# Patient Record
Sex: Female | Born: 1965 | Race: White | Hispanic: No | Marital: Married | Smoking: Never smoker
Health system: Southern US, Community
[De-identification: ages and names within clinical notes are randomized; demographics above are authoritative.]

## PROBLEM LIST (undated history)

## (undated) DIAGNOSIS — I10 Essential (primary) hypertension: Secondary | ICD-10-CM

## (undated) DIAGNOSIS — R42 Dizziness and giddiness: Secondary | ICD-10-CM

## (undated) HISTORY — PX: APPENDECTOMY: SHX54

---

## 2007-11-21 ENCOUNTER — Emergency Department (HOSPITAL_COMMUNITY): Admission: EM | Admit: 2007-11-21 | Discharge: 2007-11-21 | Payer: Self-pay | Admitting: Emergency Medicine

## 2009-11-18 ENCOUNTER — Emergency Department (HOSPITAL_COMMUNITY): Admission: EM | Admit: 2009-11-18 | Discharge: 2009-11-18 | Payer: Self-pay | Admitting: Emergency Medicine

## 2010-10-23 LAB — URINALYSIS, ROUTINE W REFLEX MICROSCOPIC
Bilirubin Urine: NEGATIVE
Leukocytes, UA: NEGATIVE
Nitrite: NEGATIVE
Specific Gravity, Urine: 1.02 (ref 1.005–1.030)
Urobilinogen, UA: 0.2 mg/dL (ref 0.0–1.0)
pH: 5.5 (ref 5.0–8.0)

## 2010-10-23 LAB — COMPREHENSIVE METABOLIC PANEL
AST: 15 U/L (ref 0–37)
CO2: 28 mEq/L (ref 19–32)
Calcium: 9 mg/dL (ref 8.4–10.5)
Creatinine, Ser: 0.65 mg/dL (ref 0.4–1.2)
GFR calc Af Amer: >60 mL/min (ref >60)
GFR calc non Af Amer: >60 mL/min (ref >60)
Total Protein: 7.1 g/dL (ref 6.0–8.3)

## 2010-10-23 LAB — DIFFERENTIAL
Eosinophils Relative: 0 % (ref 0–5)
Lymphocytes Relative: 8 % — ABNORMAL LOW (ref 12–46)
Lymphs Abs: 1.1 10*3/uL (ref 0.7–4.0)
Monocytes Relative: 5 % (ref 3–12)
Neutrophils Relative %: 87 % — ABNORMAL HIGH (ref 43–77)

## 2010-10-23 LAB — CBC
MCHC: 35.3 g/dL (ref 30.0–36.0)
MCV: 83.7 fL (ref 78.0–100.0)
RBC: 4.77 MIL/uL (ref 3.87–5.11)
RDW: 13.9 % (ref 11.5–15.5)

## 2010-10-23 LAB — URINE MICROSCOPIC-ADD ON

## 2010-10-23 LAB — LIPASE, BLOOD: Lipase: 22 U/L (ref 11–59)

## 2010-10-23 LAB — SEDIMENTATION RATE: Sed Rate: 12 mm/hr (ref 0–22)

## 2011-04-30 LAB — CBC
HCT: 38
Hemoglobin: 13.2
RBC: 4.43
WBC: 12.3 — ABNORMAL HIGH

## 2011-04-30 LAB — POCT CARDIAC MARKERS
Operator id: 157891
Troponin i, poc: <0.05

## 2011-04-30 LAB — DIFFERENTIAL
Eosinophils Relative: 2
Lymphocytes Relative: 23
Lymphs Abs: 2.8
Monocytes Absolute: 1
Monocytes Relative: 8

## 2011-04-30 LAB — BASIC METABOLIC PANEL
GFR calc non Af Amer: >60
Potassium: 3.8
Sodium: 140

## 2015-05-26 ENCOUNTER — Encounter (HOSPITAL_COMMUNITY): Payer: Self-pay

## 2015-05-26 ENCOUNTER — Observation Stay (HOSPITAL_COMMUNITY)
Admission: EM | Admit: 2015-05-26 | Discharge: 2015-05-27 | Disposition: A | Discharge status: Home / Self Care | Payer: Self-pay | Attending: Internal Medicine | Admitting: Internal Medicine

## 2015-05-26 ENCOUNTER — Emergency Department (HOSPITAL_COMMUNITY): Payer: Self-pay

## 2015-05-26 DIAGNOSIS — I10 Essential (primary) hypertension: Secondary | ICD-10-CM

## 2015-05-26 DIAGNOSIS — E876 Hypokalemia: Secondary | ICD-10-CM | POA: Insufficient documentation

## 2015-05-26 DIAGNOSIS — R112 Nausea with vomiting, unspecified: Secondary | ICD-10-CM | POA: Insufficient documentation

## 2015-05-26 DIAGNOSIS — R42 Dizziness and giddiness: Principal | ICD-10-CM

## 2015-05-26 DIAGNOSIS — R1110 Vomiting, unspecified: Secondary | ICD-10-CM | POA: Diagnosis present

## 2015-05-26 DIAGNOSIS — D72829 Elevated white blood cell count, unspecified: Secondary | ICD-10-CM

## 2015-05-26 DIAGNOSIS — R61 Generalized hyperhidrosis: Secondary | ICD-10-CM | POA: Insufficient documentation

## 2015-05-26 HISTORY — DX: Dizziness and giddiness: R42

## 2015-05-26 HISTORY — DX: Essential (primary) hypertension: I10

## 2015-05-26 LAB — CBC WITH DIFFERENTIAL/PLATELET
Basophils Absolute: 0 10*3/uL (ref 0.0–0.1)
Basophils Relative: 0 %
EOS ABS: 0.1 10*3/uL (ref 0.0–0.7)
Eosinophils Relative: 1 %
HEMATOCRIT: 41.8 % (ref 36.0–46.0)
HEMOGLOBIN: 14.4 g/dL (ref 12.0–15.0)
LYMPHS ABS: 1.9 10*3/uL (ref 0.7–4.0)
Lymphocytes Relative: 15 %
MCH: 29.3 pg (ref 26.0–34.0)
MCHC: 34.4 g/dL (ref 30.0–36.0)
MCV: 85 fL (ref 78.0–100.0)
MONOS PCT: 7 %
Monocytes Absolute: 0.9 10*3/uL (ref 0.1–1.0)
NEUTROS PCT: 77 %
Neutro Abs: 9.2 10*3/uL — ABNORMAL HIGH (ref 1.7–7.7)
Platelets: 248 10*3/uL (ref 150–400)
RBC: 4.92 MIL/uL (ref 3.87–5.11)
RDW: 13.3 % (ref 11.5–15.5)
WBC: 12.1 10*3/uL — ABNORMAL HIGH (ref 4.0–10.5)

## 2015-05-26 LAB — RAPID URINE DRUG SCREEN, HOSP PERFORMED
Amphetamines: NOT DETECTED
BARBITURATES: NOT DETECTED
BENZODIAZEPINES: NOT DETECTED
COCAINE: NOT DETECTED
Opiates: NOT DETECTED
TETRAHYDROCANNABINOL: NOT DETECTED

## 2015-05-26 LAB — URINALYSIS, ROUTINE W REFLEX MICROSCOPIC
BILIRUBIN URINE: NEGATIVE
Glucose, UA: NEGATIVE mg/dL
KETONES UR: NEGATIVE mg/dL
LEUKOCYTES UA: NEGATIVE
NITRITE: NEGATIVE
PROTEIN: NEGATIVE mg/dL
Specific Gravity, Urine: 1.01 (ref 1.005–1.030)
Urobilinogen, UA: 0.2 mg/dL (ref 0.0–1.0)
pH: 6.5 (ref 5.0–8.0)

## 2015-05-26 LAB — BASIC METABOLIC PANEL
Anion gap: 10 (ref 5–15)
BUN: 13 mg/dL (ref 6–20)
CHLORIDE: 104 mmol/L (ref 101–111)
CO2: 26 mmol/L (ref 22–32)
CREATININE: 0.74 mg/dL (ref 0.44–1.00)
Calcium: 8.9 mg/dL (ref 8.9–10.3)
GFR calc Af Amer: >60 mL/min (ref >60)
GFR calc non Af Amer: >60 mL/min (ref >60)
Glucose, Bld: 172 mg/dL — ABNORMAL HIGH (ref 65–99)
Potassium: 3.1 mmol/L — ABNORMAL LOW (ref 3.5–5.1)
SODIUM: 140 mmol/L (ref 135–145)

## 2015-05-26 LAB — URINE MICROSCOPIC-ADD ON

## 2015-05-26 LAB — PREGNANCY, URINE: Preg Test, Ur: NEGATIVE

## 2015-05-26 MED ORDER — ONDANSETRON HCL 4 MG/2ML IJ SOLN
4.0000 mg | Freq: Once | INTRAMUSCULAR | Status: AC
  Administered 2015-05-26: 4 mg via INTRAVENOUS
  Filled 2015-05-26: qty 2

## 2015-05-26 MED ORDER — METOCLOPRAMIDE HCL 5 MG/ML IJ SOLN
10.0000 mg | Freq: Once | INTRAMUSCULAR | Status: AC
  Administered 2015-05-26: 10 mg via INTRAVENOUS
  Filled 2015-05-26: qty 2

## 2015-05-26 MED ORDER — MECLIZINE HCL 12.5 MG PO TABS
25.0000 mg | ORAL_TABLET | Freq: Once | ORAL | Status: AC
  Administered 2015-05-26: 25 mg via ORAL
  Filled 2015-05-26: qty 2

## 2015-05-26 MED ORDER — ONDANSETRON HCL 4 MG/2ML IJ SOLN
4.0000 mg | Freq: Four times a day (QID) | INTRAMUSCULAR | Status: DC
  Administered 2015-05-27 (×2): 4 mg via INTRAVENOUS
  Filled 2015-05-26 (×2): qty 2

## 2015-05-26 MED ORDER — ENOXAPARIN SODIUM 40 MG/0.4ML ~~LOC~~ SOLN
40.0000 mg | SUBCUTANEOUS | Status: DC
  Administered 2015-05-26: 40 mg via SUBCUTANEOUS
  Filled 2015-05-26: qty 0.4

## 2015-05-26 MED ORDER — SODIUM CHLORIDE 0.9 % IV SOLN
1000.0000 mL | INTRAVENOUS | Status: DC
  Administered 2015-05-26 (×2): 1000 mL via INTRAVENOUS

## 2015-05-26 MED ORDER — ACETAMINOPHEN 650 MG RE SUPP: 650.0000 mg | Freq: Four times a day (QID) | RECTAL | Status: DC | PRN

## 2015-05-26 MED ORDER — POTASSIUM CHLORIDE IN NACL 40-0.9 MEQ/L-% IV SOLN
INTRAVENOUS | Status: DC
  Administered 2015-05-26 – 2015-05-27 (×3): 100 mL/h via INTRAVENOUS

## 2015-05-26 MED ORDER — LORAZEPAM 2 MG/ML IJ SOLN
0.5000 mg | Freq: Once | INTRAMUSCULAR | Status: AC
  Administered 2015-05-26: 0.5 mg via INTRAVENOUS
  Filled 2015-05-26: qty 1

## 2015-05-26 MED ORDER — MECLIZINE HCL 12.5 MG PO TABS
25.0000 mg | ORAL_TABLET | Freq: Three times a day (TID) | ORAL | Status: DC
  Administered 2015-05-26 – 2015-05-27 (×3): 25 mg via ORAL
  Filled 2015-05-26 (×3): qty 2

## 2015-05-26 MED ORDER — POTASSIUM CHLORIDE CRYS ER 20 MEQ PO TBCR
40.0000 meq | EXTENDED_RELEASE_TABLET | Freq: Once | ORAL | Status: AC
  Administered 2015-05-26: 40 meq via ORAL
  Filled 2015-05-26: qty 2

## 2015-05-26 MED ORDER — SODIUM CHLORIDE 0.9 % IV SOLN
1000.0000 mL | Freq: Once | INTRAVENOUS | Status: AC
  Administered 2015-05-26: 1000 mL via INTRAVENOUS

## 2015-05-26 MED ORDER — DIPHENHYDRAMINE HCL 50 MG/ML IJ SOLN
25.0000 mg | Freq: Once | INTRAMUSCULAR | Status: AC
  Administered 2015-05-26: 25 mg via INTRAVENOUS
  Filled 2015-05-26: qty 1

## 2015-05-26 MED ORDER — AMLODIPINE BESYLATE 5 MG PO TABS
5.0000 mg | ORAL_TABLET | Freq: Every day | ORAL | Status: DC
  Administered 2015-05-26 – 2015-05-27 (×2): 5 mg via ORAL
  Filled 2015-05-26 (×2): qty 1

## 2015-05-26 MED ORDER — ACETAMINOPHEN 325 MG PO TABS: 650.0000 mg | ORAL_TABLET | Freq: Four times a day (QID) | ORAL | Status: DC | PRN

## 2015-05-26 MED ORDER — LORAZEPAM 2 MG/ML IJ SOLN
1.0000 mg | Freq: Four times a day (QID) | INTRAMUSCULAR | Status: DC | PRN
  Administered 2015-05-26: 1 mg via INTRAVENOUS
  Filled 2015-05-26: qty 1

## 2015-05-26 MED ORDER — ONDANSETRON HCL 4 MG PO TABS
4.0000 mg | ORAL_TABLET | Freq: Four times a day (QID) | ORAL | Status: DC
  Administered 2015-05-26 (×2): 4 mg via ORAL
  Filled 2015-05-26 (×2): qty 1

## 2015-05-26 MED ORDER — LORAZEPAM 2 MG/ML IJ SOLN
1.0000 mg | Freq: Once | INTRAMUSCULAR | Status: AC
  Administered 2015-05-26: 1 mg via INTRAVENOUS
  Filled 2015-05-26: qty 1

## 2015-05-26 MED ORDER — POTASSIUM CHLORIDE 10 MEQ/100ML IV SOLN
10.0000 meq | Freq: Once | INTRAVENOUS | Status: AC
  Administered 2015-05-26: 10 meq via INTRAVENOUS
  Filled 2015-05-26: qty 100

## 2015-05-26 NOTE — Evaluation (Signed)
Physical Therapy Evaluation Patient Details Name: Tammy Bridges MRN: [id number] DOB: Jun 01, 1966 Today's Date: 05/26/2015   History of Present Illness  Pt is a 48yo white female who arrived at [facility] after sudden, incidious onset of dizziness, nausea, and vommitting, with unrelenting room spinning sensation. Pt reports that she is most comfortable maintaning eyes closed. She has received only moderate help with N/V with medications since she arrived.  Pt reports that she had an  "inner ear infection' on the R about two weeks ago and has been following drops as prescribed. 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 Pt denies any previous episodes similar to this one, but reports occasional episodic vertigo in the morning that lasts for less than 60 seconds.   Clinical Impression  Pt is received semirecumbent in bed upon entry, awake, alert, and willing to participate. Pt reports mild improvement in nausea and vomiting with meds intervention, but still prefers to remain lying flat with eyes closed. Pt is A&Ox3 and pleasant. Pt reports zero falls in the last 6 months, and no previous episodes similar to this one. Assessment of eye tracking demonstrating a R beating nystagmus at forward gaze, that increases in rate with eye tracking to R, and decreases in rate with tracking to L. Mild hearing deficits detected on R side with high frequency testing. Pt is presenting with signs/symptoms consistent with a classic presentation of Meneire's Disease, which warrants additional follow up from neuro and/or ENT to provide in-depth differential diagnosis. Granted pt appears to present during an acute episode, PT intervention is limited at this time. Further in-depth PT assessment of vestibular function, balance, and hearing should be performed once current episode has resolved. Patient presenting with impairment of  strength, activity tolerance, hearing deficits, and vestibular dysfunction, limiting ability to perform ADL and mobility tasks at  baseline level of function. Patient will benefit from skilled intervention to address the above impairments and limitations, in order to restore to prior level of function, improve patient safety upon discharge, and to decrease falls risk.       Follow Up Recommendations Home health PT;Supervision for mobility/OOB    Equipment Recommendations  None recommended by PT    Recommendations for Other Services       Precautions / Restrictions Precautions Precautions: None Restrictions Weight Bearing Restrictions: No      Mobility  Bed Mobility               General bed mobility comments: Does not tolerate due to N/V continued spinning sensation.   Transfers                 General transfer comment: Does not tolerate due to N/V continued spinning sensation.   Ambulation/Gait                Stairs            Wheelchair Mobility    Modified Rankin (Stroke Patients Only)       Balance Overall balance assessment:  (Inappropriate at this time due to continued R beating nystagmus., adn poor tolerance to positional changes. 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      Hand Dominance        Extremity/Trunk Assessment   Upper Extremity Assessment: Generalized weakness;Overall [facility] for tasks assessed (denies  paresthesia or focal weakness. )           Lower Extremity Assessment: Generalized weakness;Overall [facility] for tasks assessed (Difficulty walking to come to hospital; not assessed at this time in depth due to poor tolerance. denies paresthesia or focal weakness. )      Cervical / Trunk Assessment: Normal (Cervical rotational ROM is WFL and near equal bilat with symptoms worse turning toward L.)  Communication   Communication: No difficulties  Cognition Arousal/Alertness: Awake/alert (maintains eyes closed to avoid exacerbation of spinning sensation. ) Behavior During Therapy: WFL for tasks assessed/performed Overall Cognitive Status: Within Functional Limits for tasks assessed                      General Comments      Exercises        Assessment/Plan    PT Assessment Patient needs continued PT services  PT Diagnosis Generalized weakness;Difficulty walking   PT Problem List Decreased balance;Decreased activity tolerance;Decreased mobility;Decreased coordination;Other (comment) (dizziness, spinning sensation)  PT Treatment Interventions Gait training;Therapeutic activities;Balance training;Neuromuscular re-education;Patient/family education   PT Goals (Current goals can be found in the Care Plan section) Acute Rehab PT Goals Patient Stated Goal: Stop being nauseated, return to home.  PT Goal Formulation: With patient Time For Goal Achievement: 06/09/15 Potential to Achieve Goals: Good    Frequency Min 2X/week   Barriers to discharge        Co-evaluation               End of Session   Activity Tolerance: Patient tolerated treatment well;Patient limited by lethargy;Patient limited by fatigue Patient left: in bed;with family/visitor present;with bed alarm set;with call bell/phone within reach Nurse Communication: Mobility status;Other (comment)    Functional Assessment Tool Used: Clinical judgment Functional Limitation: Changing and maintaining body  position;Mobility: Walking and moving around Mobility: Walking and Moving Around Current Status [phone]): At least 80 percent but less than 100 percent impaired, limited or restricted Mobility: Walking and Moving Around Goal Status [phone]): At least 20 percent but less than 40 percent impaired, limited or restricted Changing and Maintaining Body Position Current Status (U9811): At least 60 percent but less than 80 percent impaired, limited or restricted Changing and Maintaining Body Position Goal Status (B1478): At least 1 percent but less than 20 percent impaired, limited or restricted    Time: 2956-2130 PT Time Calculation (min) (ACUTE ONLY): 20 min   Charges:   PT Evaluation $Initial PT Evaluation Tier I: 1 Procedure PT Treatments $Self Care/Home Management: 8-22   PT G Codes:   PT G-Codes **NOT FOR INPATIENT CLASS** Functional Assessment Tool Used: Clinical judgment Functional Limitation: Changing and maintaining body position;Mobility: Walking and moving around Mobility: Walking and Moving Around Current Status (Q6578): At least 80 percent but less than 100 percent impaired, limited or restricted Mobility: Walking and Moving Around Goal Status [phone]): At least 20 percent  but less than 40 percent impaired, limited or restricted Changing and Maintaining Body Position Current Status [phone](G8981): At least 60 percent but less than 80 percent impaired, limited or restricted Changing and Maintaining Body Position Goal Status (W0981(G8982): At least 1 percent but less than 20 percent impaired, limited or restricted    Janina Trafton C 05/26/2015, 4:12 PM  4:20 PM  Rosamaria LintsAllan C Brogan England, PT, DPT [place] License # [id number]

## 2015-05-26 NOTE — ED Notes (Signed)
Pt states she got up to go to the bathroom and felt dizzy, having mid sternal chest pain and tingling to both hands.

## 2015-05-26 NOTE — ED Notes (Signed)
Pt resting with eyes shut.  States she is ok if she doesn't move,  No changes in dizziness.

## 2015-05-26 NOTE — ED Provider Notes (Signed)
MRI brain negative Pt still with vertigo, and has significant nystagmus She has had multiple medications without relief Unable to perform epley maneuver as pt with vomiting with any movement Will admit D/w dr York Pellantmemom, will admit Medications  0.9 %  sodium chloride infusion (0 mLs Intravenous Stopped 05/26/15 0632)    Followed by  0.9 %  sodium chloride infusion (1,000 mLs Intravenous New Bag/Given 05/26/15 0525)  ondansetron (ZOFRAN) injection 4 mg (4 mg Intravenous Given 05/26/15 0319)  meclizine (ANTIVERT) tablet 25 mg (25 mg Oral Given 05/26/15 0300)  potassium chloride 10 mEq in 100 mL IVPB (0 mEq Intravenous Stopped 05/26/15 0631)  LORazepam (ATIVAN) injection 1 mg (1 mg Intravenous Given 05/26/15 0407)  ondansetron (ZOFRAN) injection 4 mg (4 mg Intravenous Given 05/26/15 0830)  LORazepam (ATIVAN) injection 0.5 mg (0.5 mg Intravenous Given 05/26/15 0830)  metoCLOPramide (REGLAN) injection 10 mg (10 mg Intravenous Given 05/26/15 0907)  diphenhydrAMINE (BENADRYL) injection 25 mg (25 mg Intravenous Given 05/26/15 0907)     Zadie Rhineonald Nazyia Gaugh, MD 05/26/15 [id number]

## 2015-05-26 NOTE — Plan of Care (Signed)
Problem: Acute Rehab PT Goals(only PT should resolve) Goal: Pt Will Ambulate Pt will ambulate independently using a step-through pattern and equal step length for a distances greater than 26800ft to without exacerbation of nausea, vomitting, or dizziness to demonstrate the ability to perform safe household distance ambulation at discharge.    Goal: Pt/caregiver will Perform Home Exercise Program Pt will demonstrate ability to indep perform HEP designed to improve any balance deficits found upon PT balance evaluation.

## 2015-05-26 NOTE — H&P (Addendum)
[facility] History and Physical  Tammy GanserRobin S Viall DGU:[id number] DOB: 07-08-1966 DOA: 05/26/2015  Referring physician: Zadie Rhineonald Wickline, MD PCP: Donzetta SprungANIEL, [name], MD   Chief Complaint: Vertigo  HPI: Tammy Bridges is a 49 y.o. female with no pertinent past medical hx that presented with vertigo.  Patient reports she woke up around 1am this morning with the sensation that the room was spinning. She admits to associated nausea, vomiting, generalized weakness, and blurred/double vision. Sitting up and opening her eyes exacerbate her symptoms. She has been unable to ambulate since this morning secondary to her vertigo. She is unaware of any alleviating factors and did not take anything prior to coming to the hospital. She denies any CP, SOB, urinary changes, diarrhea, abdominal pain, HA, or focal weakness.  She denies similar symptoms in the past.    While in the ED, labs revealed a mild hypokalemia and leukocytosis (appears chronic) but were otherwise unremarkable. MRI of the brain was negative for any acute changes. EKG revealed sinus rhythm without evidence of changes . She was given oral Meclizine, Reglan, and Zofran without relief. Since she had refractory vertigo and vomiting, she was admitted for further management.    Review of Systems:  Constitutional: Vertigo , generalized weakness No weight loss, night sweats, Fevers, chills. HEENT:  Blurred and double vision No headaches, Difficulty swallowing,Tooth/dental problems,Sore throat,  No sneezing, itching, ear ache, nasal congestion, post nasal drip,  Cardio-vascular:  No chest pain, Orthopnea, PND, swelling in lower extremities, anasarca, palpitations  GI:   Nausea and vomiting No heartburn, indigestion, abdominal pain, diarrhea, change in bowel habits, loss of appetite  Resp:  No shortness of breath with exertion or at rest. No excess mucus, no productive cough, No non-productive cough, No coughing up of blood.No change in color  of mucus.No wheezing.No chest wall deformity  Skin:  no rash or lesions.  GU:  no dysuria, change in color of urine, no urgency or frequency. No flank pain.  Musculoskeletal:  No joint pain or swelling. No decreased range of motion. No back pain.  Psych:  No change in mood or affect. No depression or anxiety. No memory loss.   Past Medical History  Diagnosis Date  . Hypertension    Past Surgical History  Procedure Laterality Date  . Appendectomy     Social History:  reports that she has never smoked. She does not have any smokeless tobacco history on file. She reports that she drinks alcohol. She reports that she does not use illicit drugs.  Allergies  Allergen Reactions  . Latex Hives and Rash    Family History  Problem Relation Age of Onset  . Diabetes Father      Prior to Admission medications   Medication Sig Start Date End Date Taking? Authorizing Provider  diphenhydramine-acetaminophen (TYLENOL PM) 25-500 MG TABS tablet Take 1 tablet by mouth at bedtime as needed (sleep).    Yes Historical Provider, MD   Physical Exam: Filed Vitals:   05/26/15 0630 05/26/15 0700 05/26/15 0809 05/26/15 1007  BP: 151/81 162/84 172/91 186/82  Pulse: 76 83 82 81  Temp:    97.7 F (36.5 C)  TempSrc:    Oral  Resp: 22 24 16 18   Height:      Weight:      SpO2: 94% 96% 99% 97%    Wt Readings from Last 3 Encounters:  05/26/15 104.327 kg (230 lb)  05/26/15 104.327 kg (230 lb)    General: NAD, looks comfortable HENT: moist  mucous membranes Eyes: nystagmus on horizontal gaze Cardiovascular: RRR, S1, S2  Respiratory: clear bilaterally, No wheezing, rales or rhonchi Abdomen: soft, non tender, no distention , bowel sounds normal Musculoskeletal: No edema b/l          Labs on Admission:  Basic Metabolic Panel:  Recent Labs Lab 05/26/15 0315  NA 140  K 3.1*  CL 104  CO2 26  GLUCOSE 172*  BUN 13  CREATININE 0.74  CALCIUM 8.9    CBC:  Recent Labs Lab 05/26/15 0315   WBC 12.1*  NEUTROABS 9.2*  HGB 14.4  HCT 41.8  MCV 85.0  PLT 248     Radiological Exams on Admission: [name] Contrast  05/26/2015  CLINICAL DATA:  Severe dizziness with vomiting. EXAM: MRI HEAD WITHOUT CONTRAST TECHNIQUE: Multiplanar, multiecho pulse sequences of the brain and surrounding structures were obtained without intravenous contrast. COMPARISON:  None. FINDINGS: Motion degraded study but overall diagnostic for indication. Calvarium and upper cervical spine: No focal marrow signal abnormality. 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 Orbits: No significant findings. Sinuses and Mastoids: Clear. Brain: No acute abnormality such as acute infarct, hemorrhage, hydrocephalus, or mass lesion. No evidence of large vessel occlusion. Mild but abnormal for age number of FLAIR hyperintensities within the bilateral cerebral white matter, with a slight frontal predominance. Pattern is nonspecific but usually from premature chronic small vessel disease, complicated migraines, or nonspecific vasculopathy. Pattern is not typical for demyelinating disease. IMPRESSION: 1. No acute finding or explanation for vomiting. 2. Mild and nonspecific white matter disease as described above. Electronically Signed   By: Marnee Spring M.D.   On: 05/26/2015 07:55    EKG: Independently reviewed -- sinus rhythm without evidence of changes  Assessment/Plan Active Problems:   Vertigo   Nausea with vomiting   Hypokalemia   HTN (hypertension)   Leukocytosis  1. Severe vertigo, likely BPPV. With nausea and vomiting. MRI brain unremarkable. Continue supportive treatment with antiemetics, IVF, and meclizine. Consult PT for vestibular therapy. Will check UA/U-tox.  2. Nausea and vomiting, start the patient on clear liquid diet. Advance as tolerated. Continue antiemetics.  3. Hypokalemia, will replete. Continue to monitor.  Check magnesium 4. Leukocytosis, appears chronic. Continue to monitor.  5. Dehydration, continue IVF.  6. Hypertension. Patient reports that she did not tolerate lisinopril in the past. Will start her on Amlodipine.      Code Status: Full DVT Prophylaxis: Lovenox Family Communication:  Family at bedside. Discussed with patient who understands and has no concerns at this time. Disposition Plan: Obs to medical bed. Anticipate discharge within 1-2 days.   Time spent: 50 minutes  [employer]. MD [facility] Pager [phone]  By signing my name below, I, Burnett Harry, attest that this documentation has been prepared under the direction and in the presence of [facility]. MD Electronically Signed: Burnett Harry, Scribe.  05/26/2015 1:18pm   I, Dr. Erick Blinks, personally performed the services described in this documentaiton. All medical record entries made by the scribe were at my direction and in my presence. I have reviewed the chart and agree that the record reflects my personal performance and is accurate and complete  Erick Blinks, MD, 05/26/2015 1:44 PM

## 2015-05-26 NOTE — ED Provider Notes (Signed)
CSN: [id number]     Arrival date & time 05/26/15  0206 History   First MD Initiated Contact with Patient 05/26/15 0239     Chief Complaint  Patient presents with  . Chest Pain     (Consider location/radiation/quality/duration/timing/severity/associated sxs/prior Treatment) Patient is a 49 y.o. female presenting with chest pain. The history is provided by the patient.  Chest Pain Her complaint is actually vertigo. She states she woke up with a sense of the room spinning around with associated nausea and vomiting. Symptoms are worse with her eyes open and if she sits up. She has been unable to walk because of her dizziness. She denies headache, tinnitus, ear pain, hearing loss. She denies dyspnea. She did have some diaphoresis which has resolved. She denies abdominal pain. She does relate that she was diagnosed with an outer ear infection several weeks ago. She's never had similar symptoms before.  History reviewed. No pertinent past medical history. Past Surgical History  Procedure Laterality Date  . Appendectomy     No family history on file. Social History  Substance Use Topics  . Smoking status: Never Smoker   . Smokeless tobacco: None  . Alcohol Use: Yes   OB History    No data available     Review of Systems  Cardiovascular: Positive for chest pain.  All other systems reviewed and are negative.     Allergies  Review of patient's allergies indicates not on file.  Home Medications   Prior to Admission medications   Medication Sig Start Date End Date Taking? Authorizing Provider  diphenhydramine-acetaminophen (TYLENOL PM) 25-500 MG TABS tablet Take 1 tablet by mouth at bedtime as needed.   Yes Historical Provider, MD   BP 175/102 mmHg  Pulse 82  Temp(Src) 97.5 F (36.4 C) (Oral)  Resp 16  Ht 5' 5.5" (1.664 m)  Wt 230 lb (104.327 kg)  BMI 37.68 kg/m2  SpO2 98% Physical Exam  Nursing note and vitals reviewed.  49 year old female, resting comfortably and in no  acute distress. Vital signs are significant for hypertension. Oxygen saturation is 98%, which is normal. Head is normocephalic and atraumatic. PERRLA, EOMI. Oropharynx is clear. Prominent rotatory nystagmus is present. Neck is nontender and supple without adenopathy or JVD. Back is nontender and there is no CVA tenderness. Lungs are clear without rales, wheezes, or rhonchi. Chest is nontender. Heart has regular rate and rhythm without murmur. Abdomen is soft, flat, nontender without masses or hepatosplenomegaly and peristalsis is hypoactive. Extremities have no cyanosis or edema, full range of motion is present. Skin is warm and dry without rash. Neurologic: Mental status is normal, cranial nerves are intact, there are no motor or sensory deficits. Dizziness is reproduced by passive head movement.  ED Course  Procedures (including critical care time) Labs Review Results for orders placed or performed during the hospital encounter of 05/26/15  CBC with Differential  Result Value Ref Range   WBC 12.1 (H) 4.0 - 10.5 K/uL   RBC 4.92 3.87 - 5.11 MIL/uL   Hemoglobin 14.4 12.0 - 15.0 g/dL   HCT 30.8 65.7 - 84.6 %   MCV 85.0 78.0 - 100.0 fL   MCH 29.3 26.0 - 34.0 pg   MCHC 34.4 30.0 - 36.0 g/dL   RDW 96.2 95.2 - 84.1 %   Platelets 248 150 - 400 K/uL   Neutrophils Relative % 77 %   Neutro Abs 9.2 (H) 1.7 - 7.7 K/uL   Lymphocytes Relative 15 %  Lymphs Abs 1.9 0.7 - 4.0 K/uL   Monocytes Relative 7 %   Monocytes Absolute 0.9 0.1 - 1.0 K/uL   Eosinophils Relative 1 %   Eosinophils Absolute 0.1 0.0 - 0.7 K/uL   Basophils Relative 0 %   Basophils Absolute 0.0 0.0 - 0.1 K/uL  Basic metabolic panel  Result Value Ref Range   Sodium 140 135 - 145 mmol/L   Potassium 3.1 (L) 3.5 - 5.1 mmol/L   Chloride 104 101 - 111 mmol/L   CO2 26 22 - 32 mmol/L   Glucose, Bld 172 (H) 65 - 99 mg/dL   BUN 13 6 - 20 mg/dL   Creatinine, Ser 9.810.74 0.44 - 1.00 mg/dL   Calcium 8.9 8.9 - 19.110.3 mg/dL   GFR calc non  Af Amer >60 >60 mL/min   GFR calc Af Amer >60 >60 mL/min   Anion gap 10 5 - 15   Imaging Review No results found. I have personally reviewed and evaluated these images and lab results as part of my medical decision-making.   EKG Interpretation   Date/Time:  Friday May 26 2015 02:21:48 EDT Ventricular Rate:  82 PR Interval:  200 QRS Duration: 104 QT Interval:  423 QTC Calculation: 494 R Axis:   12 Text Interpretation:  Sinus rhythm LVH with secondary repolarization  abnormality Borderline prolonged QT interval When compared with ECG of  11/21/2007, Left ventricular hypertrophy with secondary repolarization  abnormality is now Present QT has lengthened Confirmed by [facility]  MD, Deontaye Civello  ([id number]) on 05/26/2015 2:27:04 AM      MDM   Final diagnoses:  Vertigo  Hypokalemia    Peripheral vertigo. She'll be given IV fluids, ondansetron for nausea, and oral meclizine.  Following above-noted treatment, nausea was improved but vertigo was not improved. She was given a dose of lorazepam without any improvement in vertigo. Laboratory workup does show a low potassium and she is given a dose of intravenous potassium. Because of failure to respond to treatment, will send for MRI scan. Case will need to be signed out to oncoming physician.  Dione Boozeavid Bently Wyss, MD 05/26/15 [phone]

## 2015-05-26 NOTE — ED Notes (Signed)
[facility]   Vomiting,  States her dizziness is not better.

## 2015-05-27 ENCOUNTER — Encounter (HOSPITAL_COMMUNITY): Payer: Self-pay | Admitting: Internal Medicine

## 2015-05-27 DIAGNOSIS — R1110 Vomiting, unspecified: Secondary | ICD-10-CM | POA: Diagnosis present

## 2015-05-27 LAB — BASIC METABOLIC PANEL
ANION GAP: 5 (ref 5–15)
BUN: 8 mg/dL (ref 6–20)
CALCIUM: 8.6 mg/dL — AB (ref 8.9–10.3)
CO2: 24 mmol/L (ref 22–32)
CREATININE: 0.58 mg/dL (ref 0.44–1.00)
Chloride: 111 mmol/L (ref 101–111)
Glucose, Bld: 112 mg/dL — ABNORMAL HIGH (ref 65–99)
Potassium: 4.3 mmol/L (ref 3.5–5.1)
SODIUM: 140 mmol/L (ref 135–145)

## 2015-05-27 LAB — CBC
HCT: 40.8 % (ref 36.0–46.0)
HEMOGLOBIN: 13.5 g/dL (ref 12.0–15.0)
MCH: 28.7 pg (ref 26.0–34.0)
MCHC: 33.1 g/dL (ref 30.0–36.0)
MCV: 86.8 fL (ref 78.0–100.0)
PLATELETS: 272 10*3/uL (ref 150–400)
RBC: 4.7 MIL/uL (ref 3.87–5.11)
RDW: 13.7 % (ref 11.5–15.5)
WBC: 9.8 10*3/uL (ref 4.0–10.5)

## 2015-05-27 LAB — MAGNESIUM: MAGNESIUM: 2 mg/dL (ref 1.7–2.4)

## 2015-05-27 MED ORDER — AMLODIPINE BESYLATE 5 MG PO TABS: 5.0000 mg | ORAL_TABLET | Freq: Every day | ORAL | Status: DC

## 2015-05-27 MED ORDER — MECLIZINE HCL 25 MG PO TABS: ORAL_TABLET | ORAL | Status: AC

## 2015-05-27 MED ORDER — LORAZEPAM 0.5 MG PO TABS: 0.5000 mg | ORAL_TABLET | Freq: Three times a day (TID) | ORAL | Status: AC | PRN

## 2015-05-27 MED ORDER — ONDANSETRON HCL 4 MG PO TABS: 4.0000 mg | ORAL_TABLET | Freq: Four times a day (QID) | ORAL | Status: AC

## 2015-05-27 NOTE — Discharge Summary (Signed)
Physician Discharge Summary  Tammy Bridges GNF:[id number] DOB: 1965/10/28 DOA: 05/26/2015  PCP: Donzetta SprungANIEL, [name], MD  Admit date: 05/26/2015 Discharge date: 05/27/2015  Time spent: 30 minutes  Recommendations for Outpatient Follow-up:  1.  Patient instructed not to drive for 1 week and/or until her vertigo resolves.   Discharge Diagnoses:  1. Dizziness secondary to vertigo, likely benign positional vertigo. 2. Intractable nausea and vomiting secondary to vertigo.  3. Hypertension.  4. Hypokalemia. 5. Leukocytosis, reactive. Resolved.   Discharge Condition: Improved.  Diet recommendation: heart healthy.  Filed Weights   05/26/15 0218  Weight: 104.327 kg (230 lb)    History of present illness:  Patient is a 49 year old woman with a history of hypertension, who presented to the ED on 05/26/2015 with a chief complaint of vertigo. In the ED, she was afebrile and hemodynamically stable. Her lab data were significant for mild hypokalemia and leukocytosis. MRI of the brain revealed no acute findings. EKG revealed normal sinus rhythm without acute changes. She was admitted for further evaluation and management.  Hospital Course:  Patient was given meclizine, Reglan, and Zofran in the ED with minimal relief. She was started on IV fluid hydration. She was continued on meclizine 25 mg 3 times a day, scheduled Zofran, and as needed lorazepam. She had not been able to tolerate lisinopril in the past for treatment of her hypertension. What she was able to take by mouth without nausea and vomiting, amlodipine was started. She appeared to tolerate it well. They instructed her on maneuvers to avoid vertigo. Over the course of the hospitalization, her vertigo subsided. Her nausea and vomiting resolved. Physical therapy was consulted. Her diet was advanced which she tolerated well.  At the time of discharge, the patient's nystagmus had decreased per report by the patient's sister. Patient was able to  ambulate to the bathroom without significant dizziness. Her white blood cell count normalized. Her serum potassium improved from 3.1 to 4.3 upon discharge.  Patient was instructed not to drive until her vertigo completely resolves or in one week, whichever comes first. She voiced understanding.  Procedures:  None   Consultations:  None  Discharge Exam: Filed Vitals:   05/27/15 0553  BP: 164/81  Pulse: 74  Temp: 97.9 F (36.6 C)  Resp: 16   oxygen saturation 97% on room air.  General: alert 49 year old woman in no acute distress. Cardiovascular: S1, S2, no murmurs rubs or gallops.  Respiratory: clear to auscultation bilaterally.  Neurologic: She is alert and oriented 3. Cranial nerves II through XII are intact, except she does have very mild rotary nystagmus. Her speech is clear.  Discharge Instructions   Discharge Instructions    Diet - low sodium heart healthy    Complete by:  As directed      Driving Restrictions    Complete by:  As directed   No driving for 1 week and/or if you are still having dizziness.     Increase activity slowly    Complete by:  As directed           Current Discharge Medication List    START taking these medications   Details  amLODipine (NORVASC) 5 MG tablet Take 1 tablet (5 mg total) by mouth daily. For high blood pressure Qty: 30 tablet, Refills: 2    LORazepam (ATIVAN) 0.5 MG tablet Take 1 tablet (0.5 mg total) by mouth every 8 (eight) hours as needed for anxiety (And nausea). Qty: 10 tablet, Refills: 0    meclizine (  ANTIVERT) 25 MG tablet Take 1 tablet three times daily for 1 week; then 1 tablet three times daily only as needed for vertigo/dizziness. Qty: 60 tablet, Refills: 1    ondansetron (ZOFRAN) 4 MG tablet Take 1 tablet (4 mg total) by mouth every 6 (six) hours. Qty: 20 tablet, Refills: 0      STOP taking these medications     diphenhydramine-acetaminophen (TYLENOL PM) 25-500 MG TABS tablet        Allergies   Allergen Reactions  . Latex Hives and Rash   Follow-up Information    Follow up with [name], MD In 1 week.   Specialty:  Family Medicine   Why:  Follow up in 1 week or less. Call for the appt.   Contact information:   [address] [place] [address] [phone]        The results of significant diagnostics from this hospitalization (including imaging, microbiology, ancillary and laboratory) are listed below for reference.    Significant Diagnostic Studies: [name] Contrast  05/26/2015  CLINICAL DATA:  Severe dizziness with vomiting. EXAM: MRI HEAD WITHOUT CONTRAST TECHNIQUE: Multiplanar, multiecho pulse sequences of the brain and surrounding structures were obtained without intravenous contrast. COMPARISON:  None. FINDINGS: Motion degraded study but overall diagnostic for indication. Calvarium and upper cervical spine: No focal marrow signal abnormality. 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 Orbits: No significant findings. Sinuses and Mastoids: Clear. Brain: No acute abnormality such as acute infarct, hemorrhage, hydrocephalus, or mass lesion. No evidence of large vessel occlusion. Mild but abnormal for age number of FLAIR hyperintensities within the bilateral cerebral white matter, with a slight frontal predominance. Pattern is nonspecific but usually from premature chronic small vessel disease, complicated migraines, or nonspecific vasculopathy. Pattern is not typical for demyelinating disease. IMPRESSION: 1. No acute finding or explanation for vomiting. 2. Mild and nonspecific white matter disease as described above. Electronically Signed   By: Marnee Spring M.D.   On: 05/26/2015 07:55    Microbiology: No results found for this or any previous visit (from the past 240 hour(s)).   Labs: Basic Metabolic Panel:  Recent Labs Lab 05/26/15 0315 05/27/15 0604  NA 140 140  K 3.1*  4.3  CL 104 111  CO2 26 24  GLUCOSE 172* 112*  BUN 13 8  CREATININE 0.74 0.58  CALCIUM 8.9 8.6*  MG  --  2.0   Liver Function Tests: No results for input(s): AST, ALT, ALKPHOS, BILITOT, PROT, ALBUMIN in the last 168 hours. No results for input(s): LIPASE, AMYLASE in the last 168 hours. No results for input(s): AMMONIA in the last 168 hours. CBC:  Recent Labs Lab 05/26/15 0315 05/27/15 0604  WBC 12.1* 9.8  NEUTROABS 9.2*  --   HGB 14.4 13.5  HCT 41.8 40.8  MCV 85.0 86.8  PLT 248 272   Cardiac Enzymes: No results for input(s): CKTOTAL, CKMB, CKMBINDEX, TROPONINI in the last 168 hours. BNP: BNP (last 3 results) No results for input(s): BNP in the last 8760 hours.  ProBNP (last 3 results) No results for input(s): PROBNP in the last 8760 hours.  CBG: No results for input(s): GLUCAP in the last 168 hours.     Signed:  Pessy Delamar  [facility] 05/27/2015, 10:31 AM

## 2015-05-27 NOTE — Progress Notes (Signed)
Patient states understanding of discharge instructions, prescriptions given 

## 2016-05-06 IMAGING — MR MR HEAD W/O CM
6 of 11 series · 22 of 48 positions shown · non-contrast
Comparison: None.

CLINICAL DATA: Severe dizziness with vomiting.

EXAM:
MRI HEAD WITHOUT CONTRAST
TECHNIQUE: Multiplanar, multiecho pulse sequences of the brain and surrounding
structures were obtained without intravenous contrast.

[Series 5: T1 · sagittal · 5.0mm · 0.41mm/px · 2 of 21 slices shown (1 of 2)]
[im 1/21]
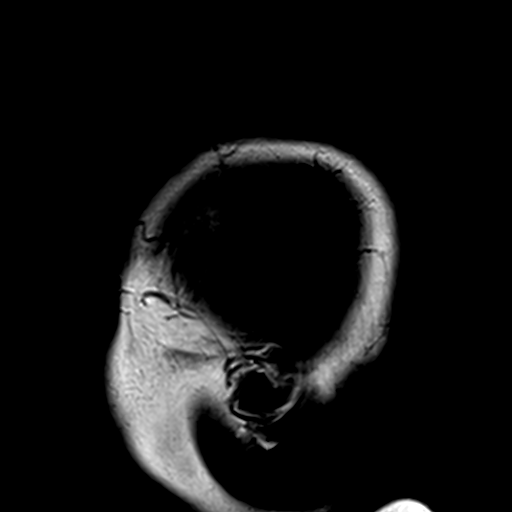
[im 21/21]
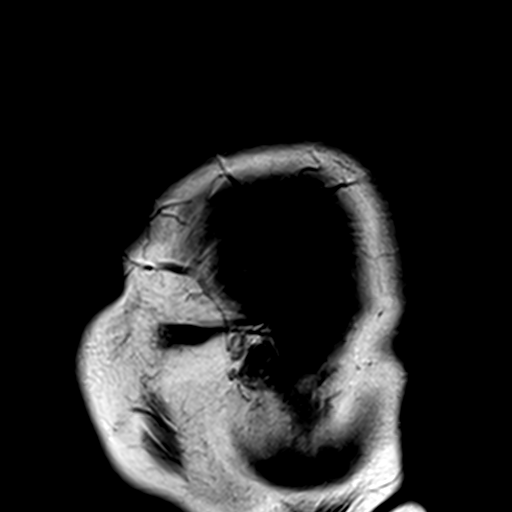

[Series 6: T2 · axial · 5.0mm · 0.44mm/px · z∈[-52,+91]mm · 3 of 23 slices shown (1 of 3)]
[im 1/23]
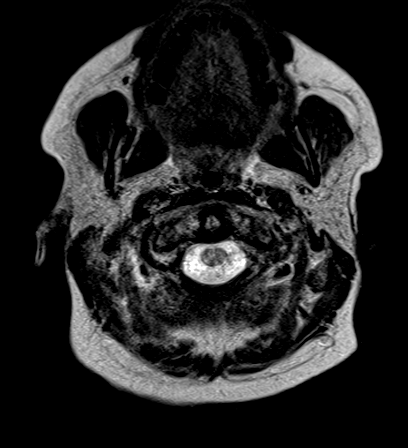
[im 12/23]
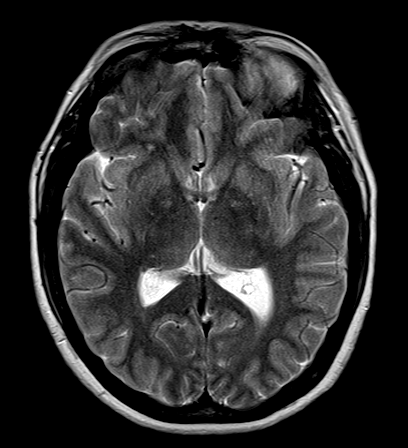
[im 23/23]
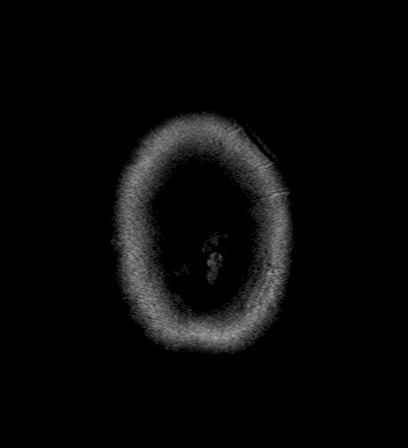

[Series 7: FLAIR · axial · 5.0mm · 0.34mm/px · z∈[-52,+91]mm · 3 of 23 slices shown]
[im 1/23]
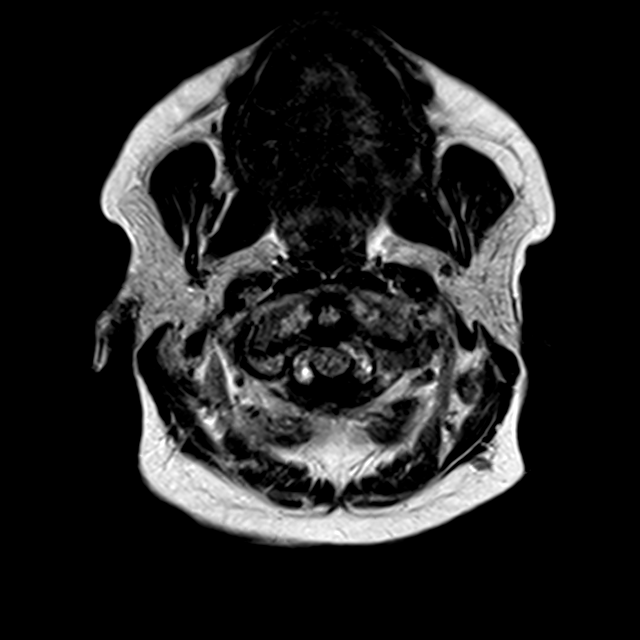
[im 12/23]
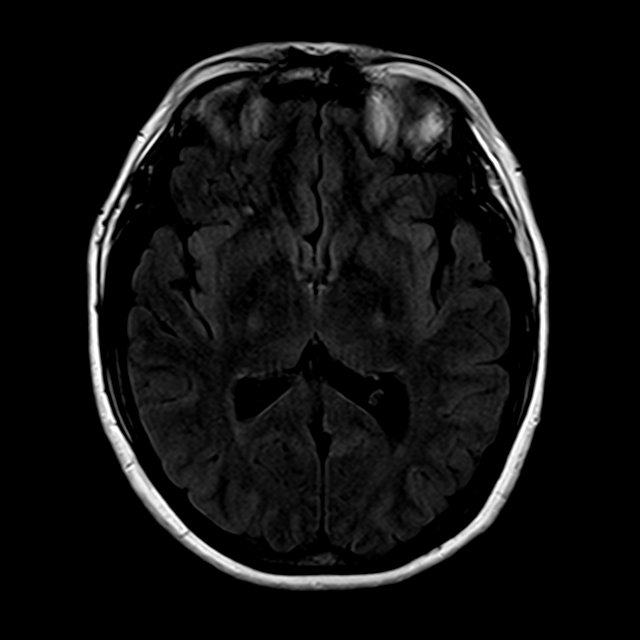
[im 23/23]
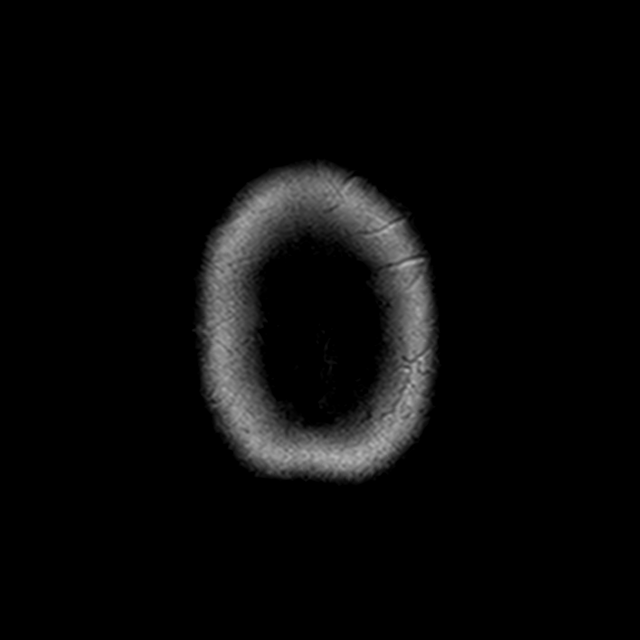

[Series 8: T1 · axial · 2.0mm · 0.40mm/px · z∈[-59,+92]mm · 8 of 77 slices shown (2 of 2)]
[im 1/77]
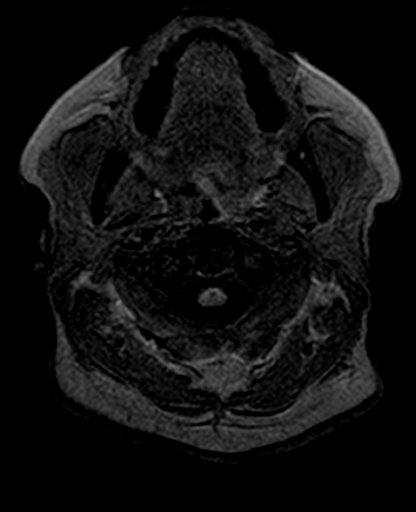
[im 10/77]
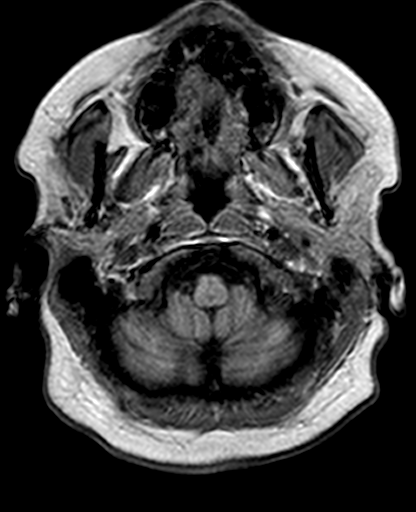
[im 20/77]
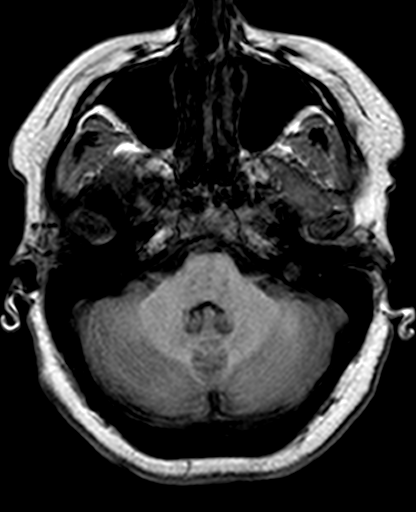
[im 29/77]
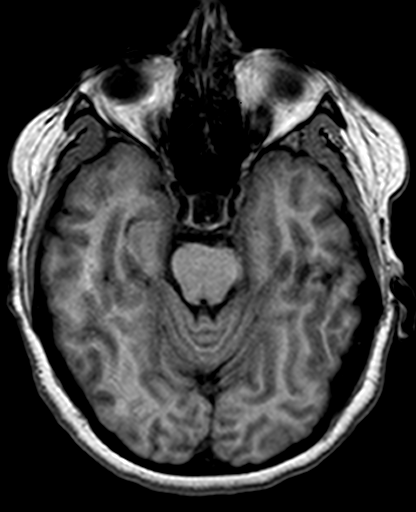
[im 48/77]
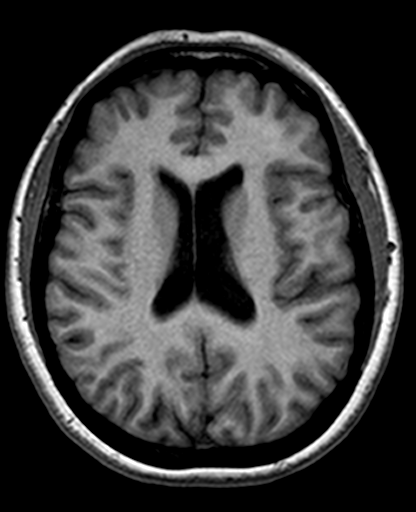
[im 58/77]
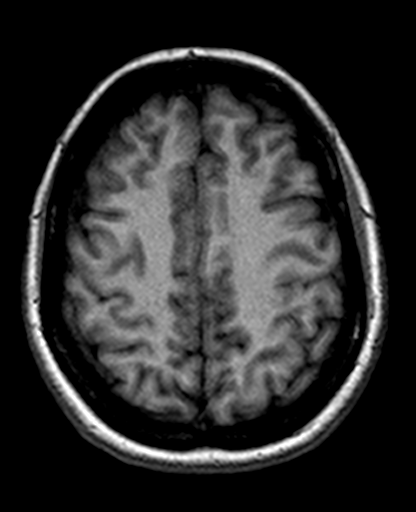
[im 67/77]
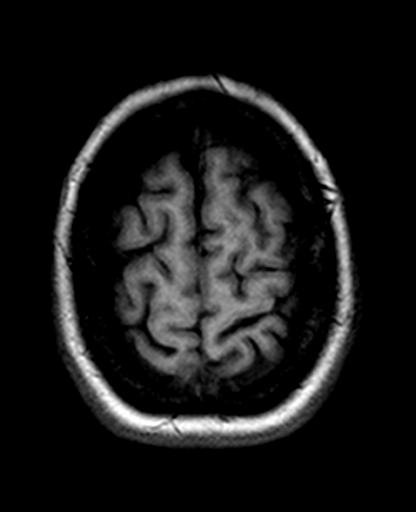
[im 77/77]
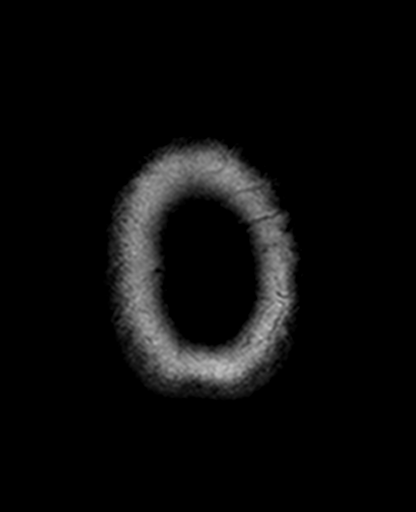

[Series 10: T2 · coronal · 5.0mm · 0.40mm/px · 3 of 28 slices shown (2 of 3)]
[im 1/28]
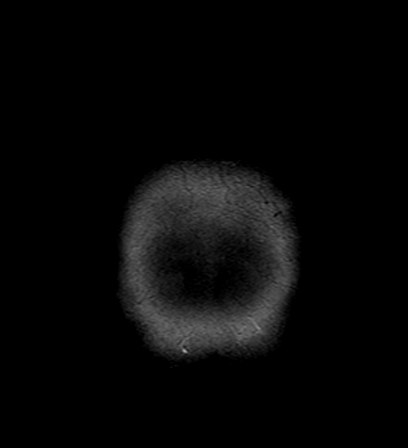
[im 14/28]
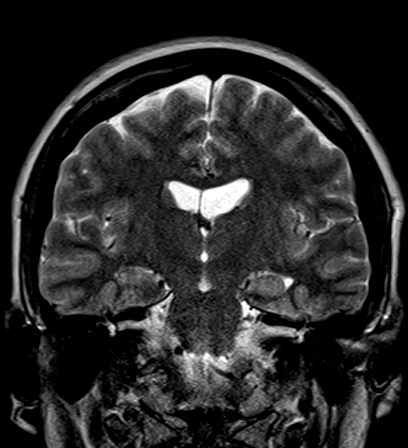
[im 28/28]
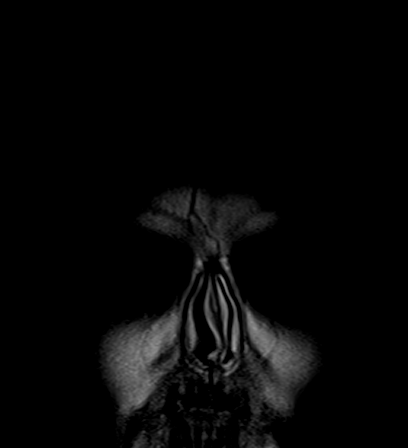

[Series 11: T2 · axial · 5.0mm · 0.46mm/px · z∈[-51,+91]mm · 3 of 23 slices shown (3 of 3)]
[im 1/23]
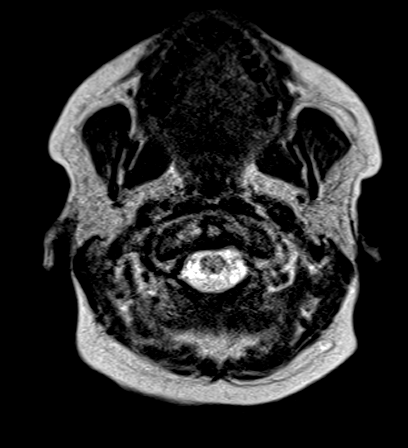
[im 12/23]
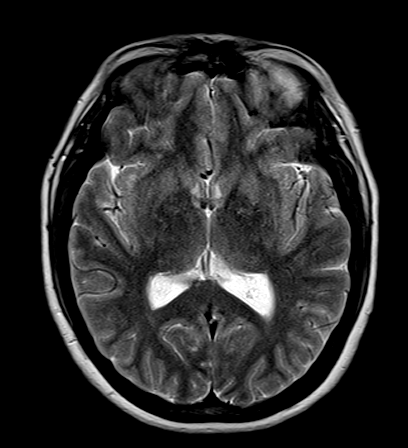
[im 23/23]
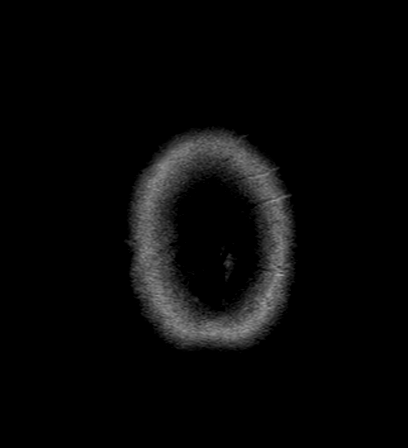

[22 of 48 positions shown; findings below may reference images not displayed]

FINDINGS: Motion degraded study but overall diagnostic for indication.

Calvarium and upper cervical spine: No focal marrow signal
abnormality. Expanded appearance of the sella compatible with
diaphragm sella incompetence, considered incidental based on the
provided history.

Orbits: No significant findings.

Sinuses and Mastoids: Clear.

Brain: No acute abnormality such as acute infarct, hemorrhage,
hydrocephalus, or mass lesion. No evidence of large vessel
occlusion.

Mild but abnormal for age number of FLAIR hyperintensities within
the bilateral cerebral white matter, with a slight frontal
predominance. Pattern is nonspecific but usually from premature
chronic small vessel disease, complicated migraines, or nonspecific
vasculopathy. Pattern is not typical for demyelinating disease.
IMPRESSION: 1. No acute finding or explanation for vomiting.
2. Mild and nonspecific white matter disease as described above.

## 2020-11-27 ENCOUNTER — Encounter: Payer: Self-pay | Admitting: Nurse Practitioner

## 2020-11-27 ENCOUNTER — Other Ambulatory Visit: Payer: Self-pay

## 2020-11-27 ENCOUNTER — Ambulatory Visit (INDEPENDENT_AMBULATORY_CARE_PROVIDER_SITE_OTHER): Payer: Self-pay | Admitting: Nurse Practitioner

## 2020-11-27 VITALS — BP 156/80 | HR 74 | Temp 97.9°F | Ht 65.0 in | Wt 234.0 lb

## 2020-11-27 DIAGNOSIS — I10 Essential (primary) hypertension: Secondary | ICD-10-CM

## 2020-11-27 MED ORDER — AMLODIPINE BESYLATE 5 MG PO TABS: 5.0000 mg | ORAL_TABLET | Freq: Every day | ORAL | 2 refills | Status: DC

## 2020-11-27 MED ORDER — AMLODIPINE BESYLATE 5 MG PO TABS: 5.0000 mg | ORAL_TABLET | Freq: Every day | ORAL | 1 refills | Status: AC

## 2020-11-27 NOTE — Patient Instructions (Signed)
Started patient on amlodipine 5 mg tablet.  Keep blood pressure log for a week follow-up in 2 weeks bring blood pressure machine for calibration.  Hypertension, Adult Hypertension is another name for high blood pressure. High blood pressure forces your heart to work harder to pump blood. This can cause problems over time. There are two numbers in a blood pressure reading. There is a top number (systolic) over a bottom number (diastolic). It is best to have a blood pressure that is below 120/80. Healthy choices can help lower your blood pressure, or you may need medicine to help lower it. What are the causes? The cause of this condition is not known. Some conditions may be related to high blood pressure. What increases the risk?  Smoking.  Having type 2 diabetes mellitus, high cholesterol, or both.  Not getting enough exercise or physical activity.  Being overweight.  Having too much fat, sugar, calories, or salt (sodium) in your diet.  Drinking too much alcohol.  Having long-term (chronic) kidney disease.  Having a family history of high blood pressure.  Age. Risk increases with age.  Race. You may be at higher risk if you are African American.  Gender. Men are at higher risk than women before age 34. After age 41, women are at higher risk than men.  Having obstructive sleep apnea.  Stress. What are the signs or symptoms?  High blood pressure may not cause symptoms. Very high blood pressure (hypertensive crisis) may cause: ? Headache. ? Feelings of worry or nervousness (anxiety). ? Shortness of breath. ? Nosebleed. ? A feeling of being sick to your stomach (nausea). ? Throwing up (vomiting). ? Changes in how you see. ? Very bad chest pain. ? Seizures. How is this treated?  This condition is treated by making healthy lifestyle changes, such as: ? Eating healthy foods. ? Exercising more. ? Drinking less alcohol.  Your health care provider may prescribe medicine if  lifestyle changes are not enough to get your blood pressure under control, and if: ? Your top number is above 130. ? Your bottom number is above 80.  Your personal target blood pressure may vary. Follow these instructions at home: Eating and drinking  If told, follow the DASH eating plan. To follow this plan: ? Fill one half of your plate at each meal with fruits and vegetables. ? Fill one fourth of your plate at each meal with whole grains. Whole grains include whole-wheat pasta, brown rice, and whole-grain bread. ? Eat or drink low-fat dairy products, such as skim milk or low-fat yogurt. ? Fill one fourth of your plate at each meal with low-fat (lean) proteins. Low-fat proteins include fish, chicken without skin, eggs, beans, and tofu. ? Avoid fatty meat, cured and processed meat, or chicken with skin. ? Avoid pre-made or processed food.  Eat less than 1,500 mg of salt each day.  Do not drink alcohol if: ? Your doctor tells you not to drink. ? You are pregnant, may be pregnant, or are planning to become pregnant.  If you drink alcohol: ? Limit how much you use to:  0-1 drink a day for women.  0-2 drinks a day for men. ? Be aware of how much alcohol is in your drink. In the [place] one drink equals one 12 oz bottle of beer (355 mL), one 5 oz glass of wine (148 mL), or one 1 oz glass of hard liquor (44 mL).   Lifestyle  Work with your doctor to stay at  a healthy weight or to lose weight. Ask your doctor what the best weight is for you.  Get at least 30 minutes of exercise most days of the week. This may include walking, swimming, or biking.  Get at least 30 minutes of exercise that strengthens your muscles (resistance exercise) at least 3 days a week. This may include lifting weights or doing Pilates.  Do not use any products that contain nicotine or tobacco, such as cigarettes, e-cigarettes, and chewing tobacco. If you need help quitting, ask your doctor.  Check your blood  pressure at home as told by your doctor.  Keep all follow-up visits as told by your doctor. This is important.   Medicines  Take over-the-counter and prescription medicines only as told by your doctor. Follow directions carefully.  Do not skip doses of blood pressure medicine. The medicine does not work as well if you skip doses. Skipping doses also puts you at risk for problems.  Ask your doctor about side effects or reactions to medicines that you should watch for. Contact a doctor if you:  Think you are having a reaction to the medicine you are taking.  Have headaches that keep coming back (recurring).  Feel dizzy.  Have swelling in your ankles.  Have trouble with your vision. Get help right away if you:  Get a very bad headache.  Start to feel mixed up (confused).  Feel weak or numb.  Feel faint.  Have very bad pain in your: ? Chest. ? Belly (abdomen).  Throw up more than once.  Have trouble breathing. Summary  Hypertension is another name for high blood pressure.  High blood pressure forces your heart to work harder to pump blood.  For most people, a normal blood pressure is less than 120/80.  Making healthy choices can help lower blood pressure. If your blood pressure does not get lower with healthy choices, you may need to take medicine. This information is not intended to replace advice given to you by your health care provider. Make sure you discuss any questions you have with your health care provider. Document Revised: 04/01/2018 Document Reviewed: 04/01/2018 Elsevier Patient Education  2021 [employer].

## 2020-11-27 NOTE — Addendum Note (Signed)
Addended by: Daryll Drown on: 11/27/2020 05:16 PM   Modules accepted: Orders

## 2020-11-27 NOTE — Progress Notes (Signed)
New Patient Note  RE: Tammy Bridges MRN: [id number] DOB: [birth year] Date of Office Visit: 11/27/2020  Chief Complaint: New Patient (Initial Visit)  History of Present Illness:  Hypertension: Patient here for follow-up of elevated blood pressure. She is exercising and is adherent to low salt diet.  Blood pressure is not well controlled at home. Cardiac symptoms none. Patient denies chest pain, chest pressure/discomfort, claudication, dyspnea and exertional chest pressure/discomfort.  Cardiovascular risk factors: hypertension and obesity (BMI >= 30 kg/m2). Use of agents associated with hypertension: none. History of target organ damage: none.  Assessment and Plan: Tammy Bridges is a 54 y.o. female with: HTN (hypertension) Blood pressure not well controlled.  Patient stopped taking blood pressure medication few months ago.  Education provided to patient with printed handouts given.  Advised patient to start amlodipine 5 mg tablet by mouth daily.  Keep blood pressure log for 1 week follow-up in 2 weeks.  Come back to clinic with blood pressure machine for calibration. Reduce salt in diet, continue exercise as tolerated.  Rx sent to pharmacy.  Return in about 2 weeks (around 12/11/2020) for blood pressure.   Diagnostics:   Past Medical History: Patient Active Problem List   Diagnosis Date Noted  . Uncontrollable vomiting   . Vertigo 05/26/2015  . Nausea with vomiting 05/26/2015  . Hypokalemia 05/26/2015  . HTN (hypertension) 05/26/2015  . Leukocytosis 05/26/2015   Past Medical History:  Diagnosis Date  . Hypertension   . Vertigo 05/26/2015   Past Surgical History: Past Surgical History:  Procedure Laterality Date  . APPENDECTOMY     Medication List:  Current Outpatient Medications  Medication Sig Dispense Refill  . amLODipine (NORVASC) 5 MG tablet Take 1 tablet (5 mg total) by mouth daily. For high blood pressure 30 tablet 2  . LORazepam (ATIVAN) 0.5 MG tablet Take 1 tablet (0.5  mg total) by mouth every 8 (eight) hours as needed for anxiety (And nausea). 10 tablet 0  . meclizine (ANTIVERT) 25 MG tablet Take 1 tablet three times daily for 1 week; then 1 tablet three times daily only as needed for vertigo/dizziness. 60 tablet 1  . ondansetron (ZOFRAN) 4 MG tablet Take 1 tablet (4 mg total) by mouth every 6 (six) hours. 20 tablet 0   No current facility-administered medications for this visit.   Allergies: Allergies  Allergen Reactions  . Latex Hives and Rash   Social History: Social History   Socioeconomic History  . Marital status: Married    Spouse name: Not on file  . Number of children: Not on file  . Years of education: Not on file  . Highest education level: Not on file  Occupational History  . Not on file  Tobacco Use  . Smoking status: Never Smoker  . Smokeless tobacco: Never Used  Vaping Use  . Vaping Use: Never used  Substance and Sexual Activity  . Alcohol use: Yes    Alcohol/week: 1.0 standard drink    Types: 1 Glasses of wine per week  . Drug use: No  . Sexual activity: Not on file  Other Topics Concern  . Not on file  Social History Narrative  . Not on file   Social Determinants of Health   Financial Resource Strain: Not on file  Food Insecurity: Not on file  Transportation Needs: Not on file  Physical Activity: Not on file  Stress: Not on file  Social Connections: Not on file       Family History: Family History  Problem Relation Age of Onset  . Diabetes Father   . Hyperlipidemia Father   . Hypertension Father          Review of Systems  Constitutional: Negative.   HENT: Negative.   Respiratory: Negative.   Cardiovascular: Negative.   Gastrointestinal: Negative.   Genitourinary: Negative.   Musculoskeletal: Negative.  Negative for arthralgias.  Skin: Negative.   All other systems reviewed and are negative.  Objective: BP (!) 156/80   Pulse 74   Temp 97.9 F (36.6 C) (Temporal)   Ht 5\' 5"  (1.651 m)   Wt  234 lb (106.1 kg)   SpO2 100%   BMI 38.94 kg/m  Body mass index is 38.94 kg/m. Physical Exam Vitals reviewed.  Constitutional:      Appearance: Normal appearance.  HENT:     Head: Normocephalic.     Nose: Nose normal.  Eyes:     Conjunctiva/sclera: Conjunctivae normal.  Cardiovascular:     Rate and Rhythm: Normal rate and regular rhythm.     Pulses: Normal pulses.     Heart sounds: Normal heart sounds.  Pulmonary:     Effort: Pulmonary effort is normal.     Breath sounds: Normal breath sounds.  Musculoskeletal:        General: Normal range of motion.  Skin:    General: Skin is warm.  Neurological:     Mental Status: She is oriented to person, place, and time.  Psychiatric:        Behavior: Behavior normal.    The plan was reviewed with the patient/family, and all questions/concerned were addressed.  It was my pleasure to see Tammy Bridges today and participate in her care. Please feel free to contact me with any questions or concerns.  Sincerely,  Zella Ball NP [facility]

## 2020-11-27 NOTE — Assessment & Plan Note (Signed)
Blood pressure not well controlled.  Patient stopped taking blood pressure medication few months ago.  Education provided to patient with printed handouts given.  Advised patient to start amlodipine 5 mg tablet by mouth daily.  Keep blood pressure log for 1 week follow-up in 2 weeks.  Come back to clinic with blood pressure machine for calibration. Reduce salt in diet, continue exercise as tolerated.  Rx sent to pharmacy.

## 2022-09-05 DIAGNOSIS — Z419 Encounter for procedure for purposes other than remedying health state, unspecified: Secondary | ICD-10-CM | POA: Diagnosis not present

## 2022-10-04 DIAGNOSIS — Z419 Encounter for procedure for purposes other than remedying health state, unspecified: Secondary | ICD-10-CM | POA: Diagnosis not present

## 2022-11-04 DIAGNOSIS — Z419 Encounter for procedure for purposes other than remedying health state, unspecified: Secondary | ICD-10-CM | POA: Diagnosis not present

## 2022-12-04 DIAGNOSIS — Z419 Encounter for procedure for purposes other than remedying health state, unspecified: Secondary | ICD-10-CM | POA: Diagnosis not present

## 2023-01-04 DIAGNOSIS — Z419 Encounter for procedure for purposes other than remedying health state, unspecified: Secondary | ICD-10-CM | POA: Diagnosis not present

## 2023-01-09 ENCOUNTER — Encounter: Payer: Medicaid Other | Admitting: Family Medicine

## 2023-02-03 DIAGNOSIS — Z419 Encounter for procedure for purposes other than remedying health state, unspecified: Secondary | ICD-10-CM | POA: Diagnosis not present

## 2023-03-06 DIAGNOSIS — Z419 Encounter for procedure for purposes other than remedying health state, unspecified: Secondary | ICD-10-CM | POA: Diagnosis not present

## 2023-04-06 DIAGNOSIS — Z419 Encounter for procedure for purposes other than remedying health state, unspecified: Secondary | ICD-10-CM | POA: Diagnosis not present
